# Patient Record
Sex: Female | Born: 1945
Health system: Southern US, Community
[De-identification: ages and names within clinical notes are randomized; demographics above are authoritative.]

## PROBLEM LIST (undated history)

## (undated) DIAGNOSIS — M1990 Unspecified osteoarthritis, unspecified site: Secondary | ICD-10-CM

## (undated) DIAGNOSIS — I10 Essential (primary) hypertension: Secondary | ICD-10-CM

## (undated) HISTORY — PX: TOTAL HIP ARTHROPLASTY: SHX124

## (undated) HISTORY — PX: ABDOMINAL HYSTERECTOMY: SHX81

---

## 2006-07-02 ENCOUNTER — Ambulatory Visit: Payer: Self-pay | Admitting: Family Medicine

## 2007-07-13 ENCOUNTER — Ambulatory Visit: Payer: Self-pay | Admitting: Family Medicine

## 2007-07-15 ENCOUNTER — Ambulatory Visit: Payer: Self-pay | Admitting: Family Medicine

## 2008-08-08 ENCOUNTER — Ambulatory Visit: Payer: Self-pay | Admitting: Family Medicine

## 2010-10-19 IMAGING — MG MAM DGTL SCREENING MAMMO W/CAD
1 series · 4 of 4 positions shown · non-contrast
Comparison: none

REASON FOR EXAM: Screening mammogram
COMMENTS:

[R CC · right · 4 of 4 slices shown]
[im 1/4]
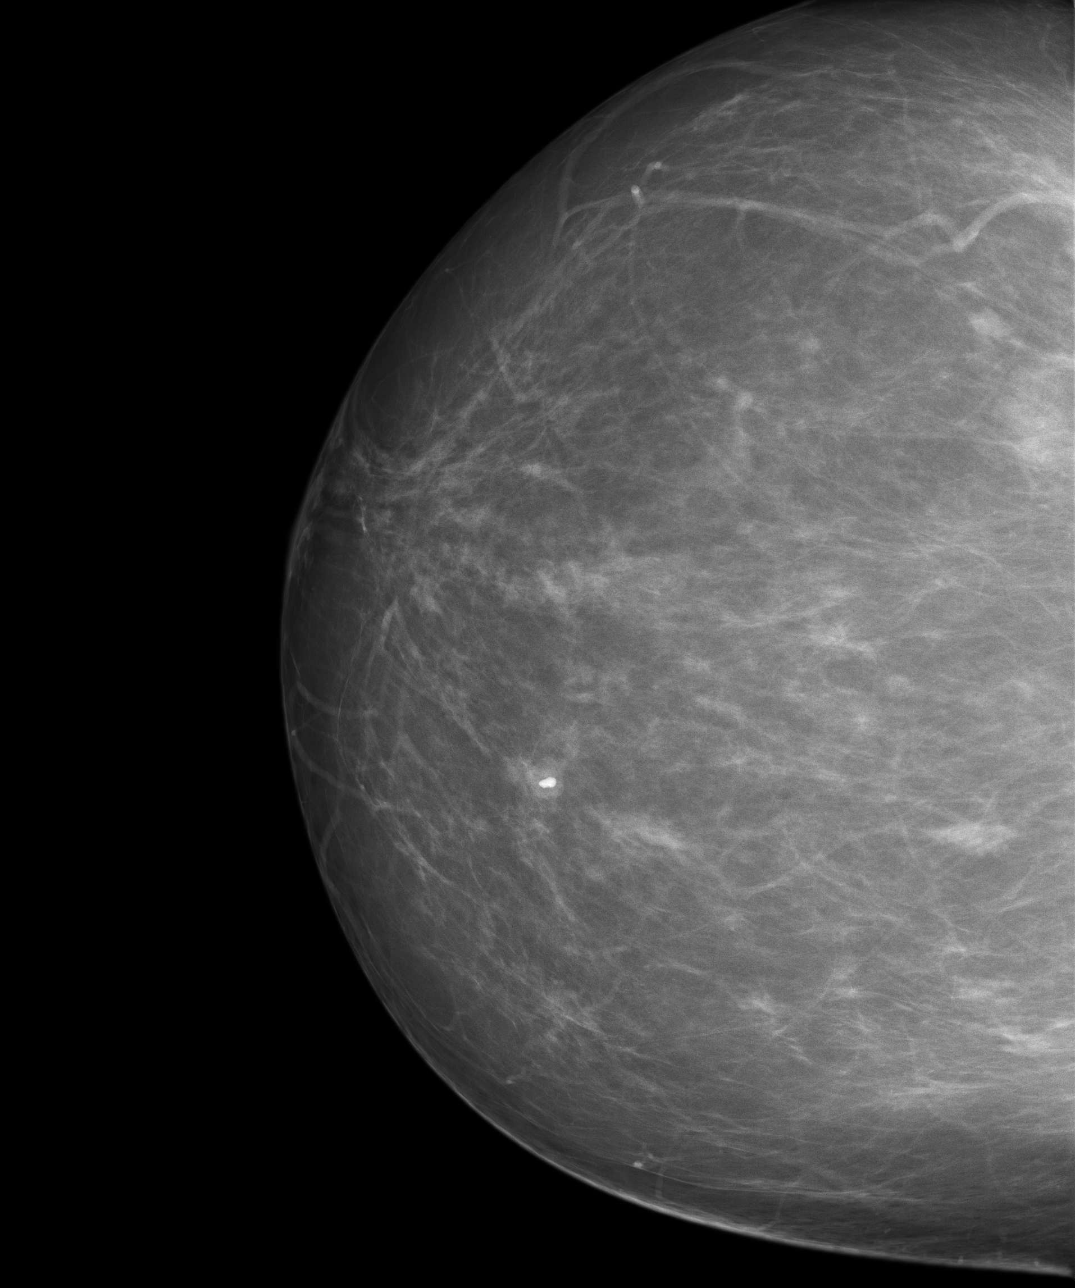
[im 2/4]
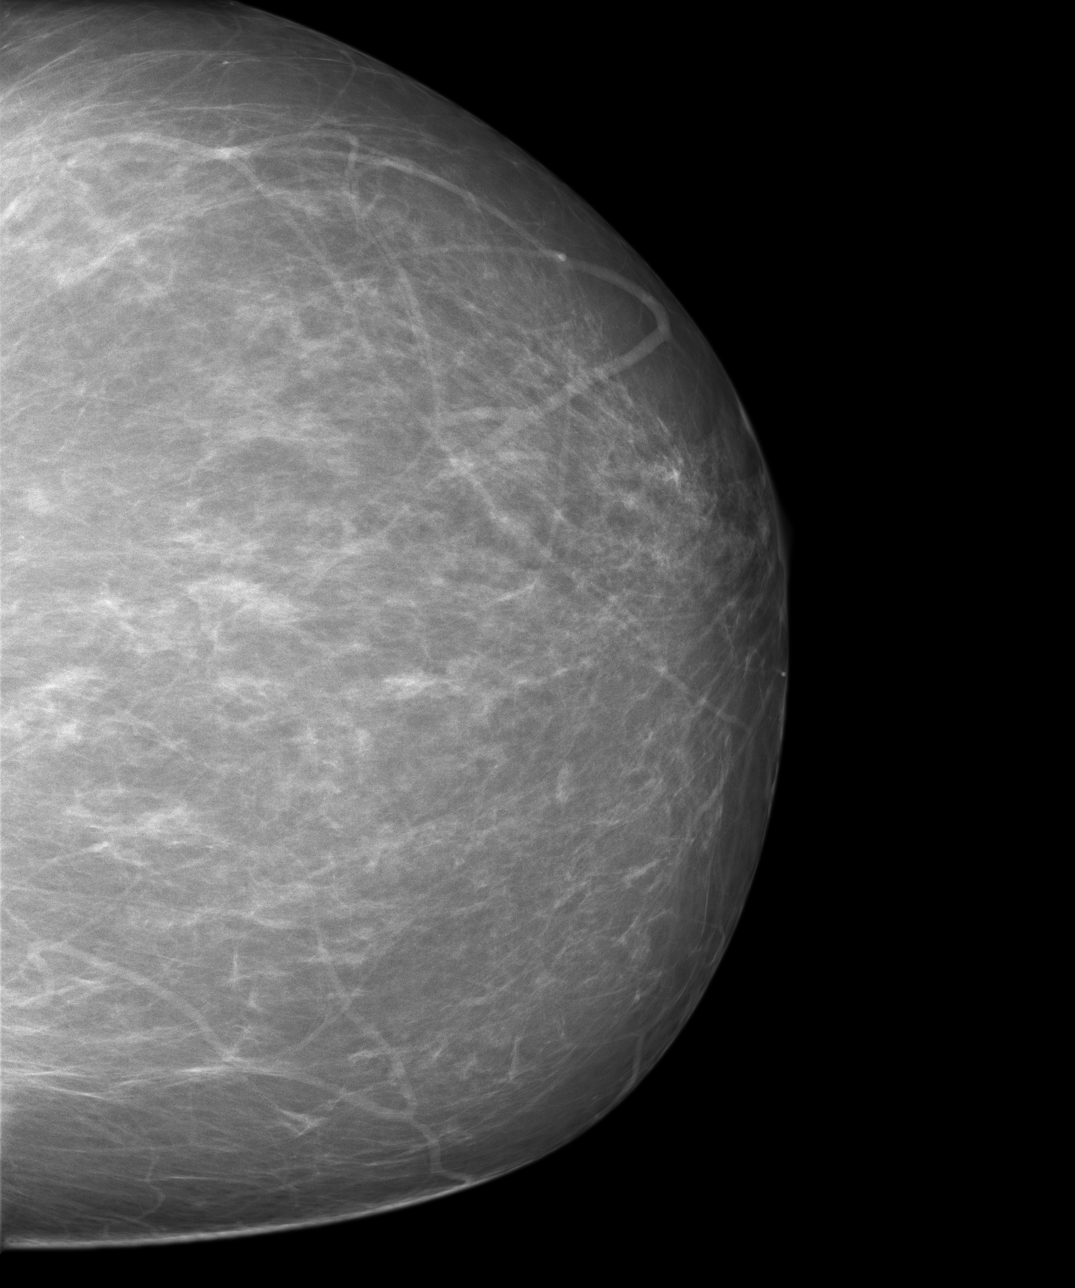
[im 3/4]
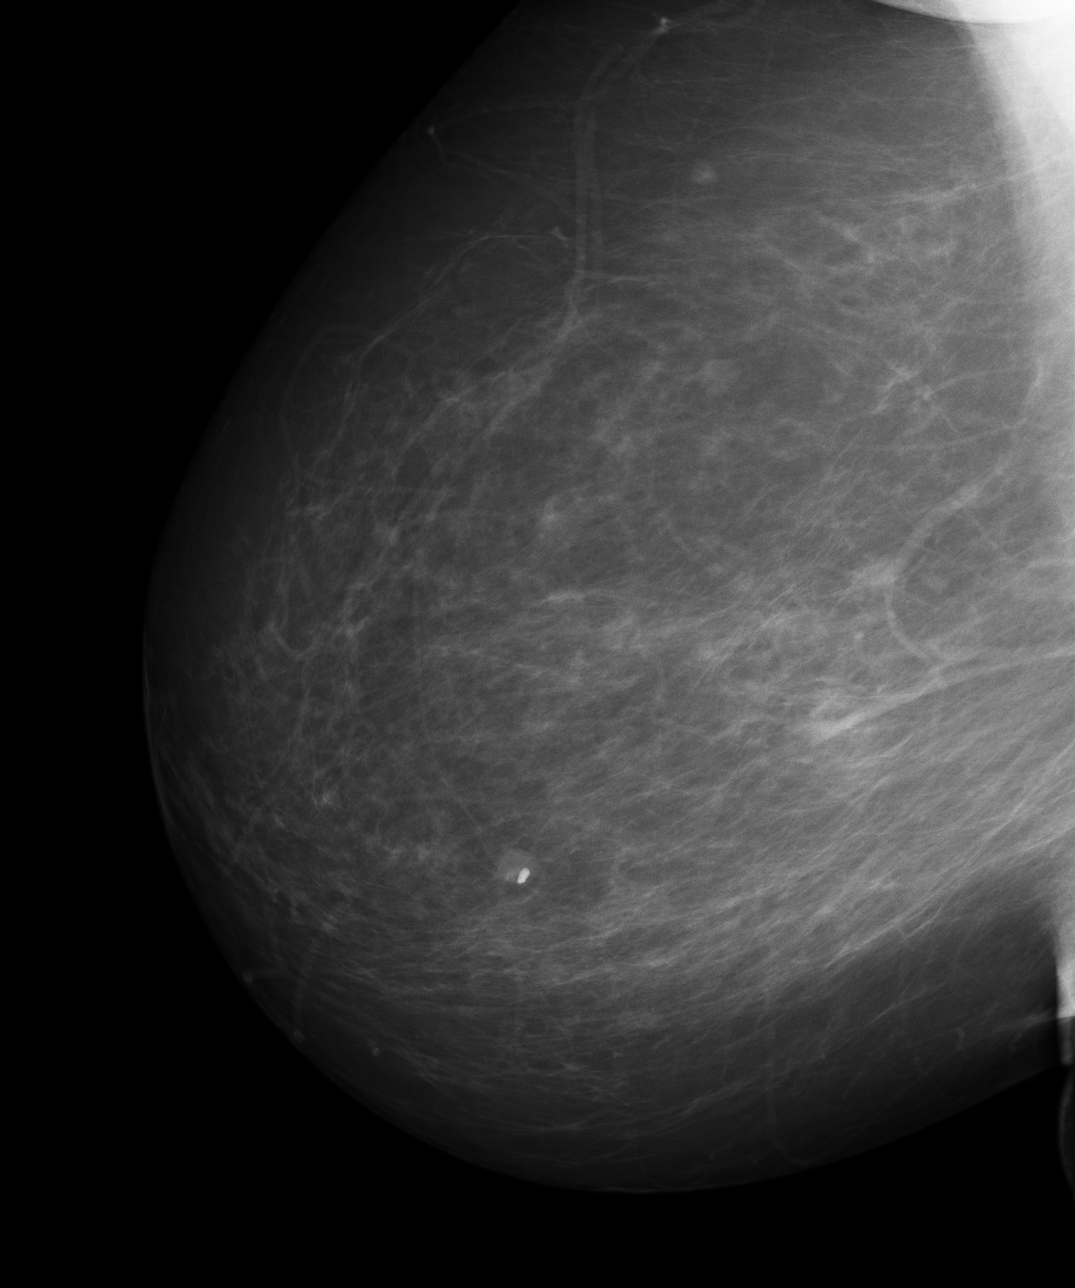
[im 4/4]
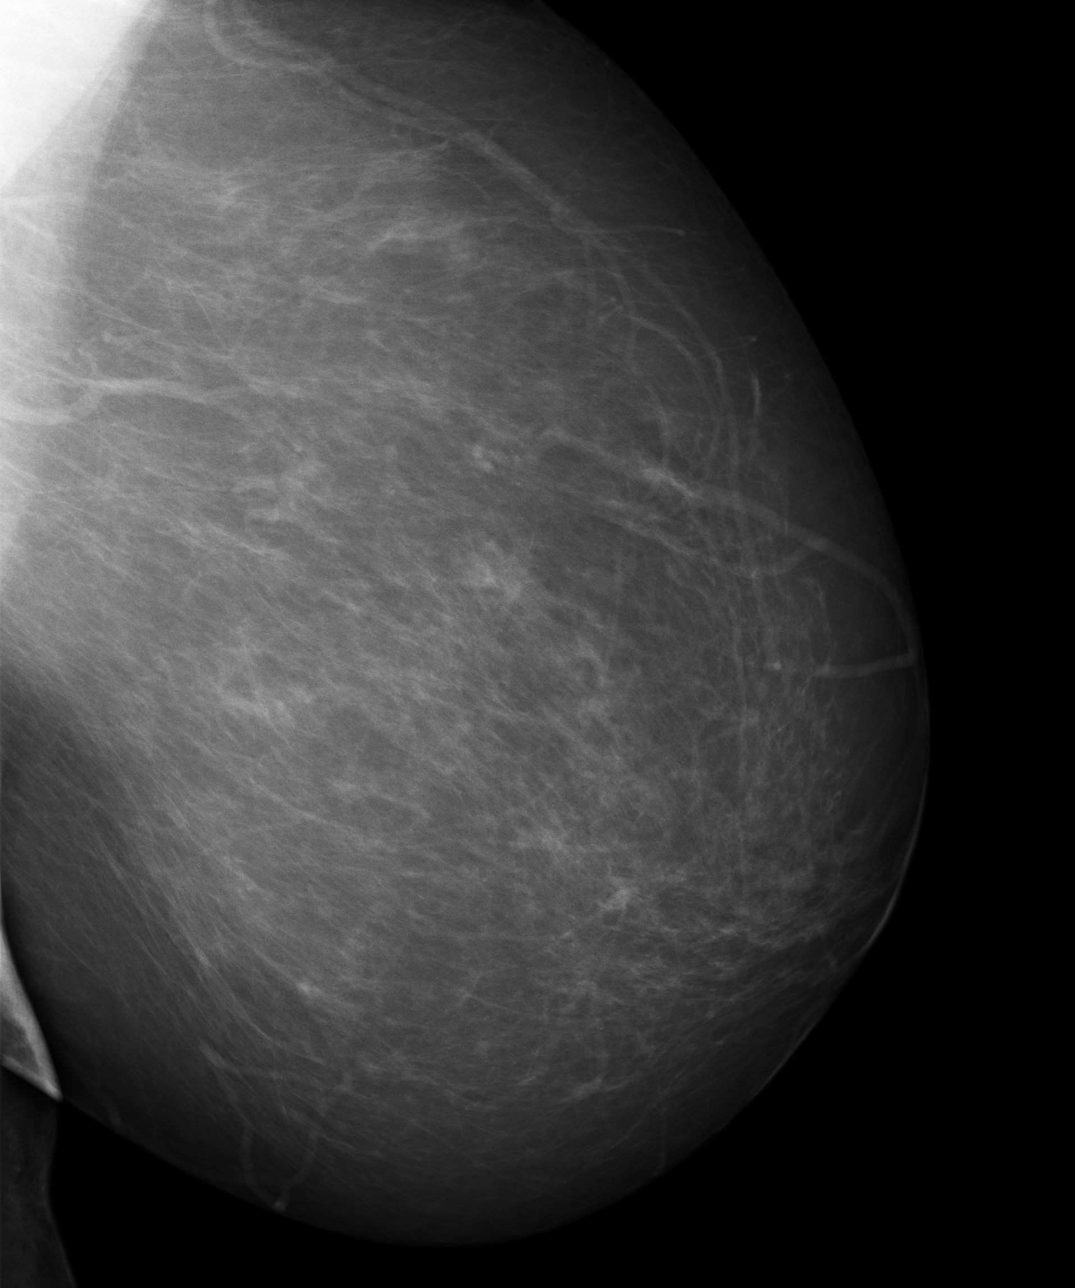

[4 of 4 positions shown; findings below may reference images not displayed]

PROCEDURE:     MAM - MAM DGTL SCREENING MAMMO W/CAD  - August 08, 2008  [DATE]

RESULT:     Comparison is made to prior studies dated 03/21/2003, 07/02/2006
and 07/13/2007.

The breasts demonstrate a moderate heterogeneous parenchymal pattern.
Coarse, stable, benign appearing calcifications are demonstrated within the
RIGHT breast. There does not appear to be new radiographic evidence to
suggest malignancy. The area of asymmetric density described within the
RIGHT breast appears unchanged when compared to previous studies.
IMPRESSION: BI-RADS: Category 2 - Benign Finding

## 2018-09-20 ENCOUNTER — Other Ambulatory Visit: Payer: Self-pay | Admitting: Student

## 2018-09-20 DIAGNOSIS — Z1231 Encounter for screening mammogram for malignant neoplasm of breast: Secondary | ICD-10-CM

## 2018-09-30 ENCOUNTER — Encounter (INDEPENDENT_AMBULATORY_CARE_PROVIDER_SITE_OTHER): Payer: Self-pay

## 2018-09-30 ENCOUNTER — Ambulatory Visit
Admission: RE | Admit: 2018-09-30 | Discharge: 2018-09-30 | Disposition: A | Discharge status: Home / Self Care | Payer: Medicare HMO | Source: Ambulatory Visit | Attending: Student | Admitting: Student

## 2018-09-30 DIAGNOSIS — Z1231 Encounter for screening mammogram for malignant neoplasm of breast: Secondary | ICD-10-CM | POA: Insufficient documentation

## 2019-10-12 ENCOUNTER — Other Ambulatory Visit: Payer: Self-pay | Admitting: Student

## 2019-10-19 ENCOUNTER — Other Ambulatory Visit: Payer: Self-pay | Admitting: Student

## 2019-10-19 DIAGNOSIS — Z1231 Encounter for screening mammogram for malignant neoplasm of breast: Secondary | ICD-10-CM

## 2020-12-10 IMAGING — MG DIGITAL SCREENING BILATERAL MAMMOGRAM WITH TOMO AND CAD
3 series · 3 of 11 positions shown · non-contrast
Comparison: Previous exam(s).

CLINICAL DATA: Screening.

EXAM:
DIGITAL SCREENING BILATERAL MAMMOGRAM WITH TOMO AND CAD

[R CC synth-2D]
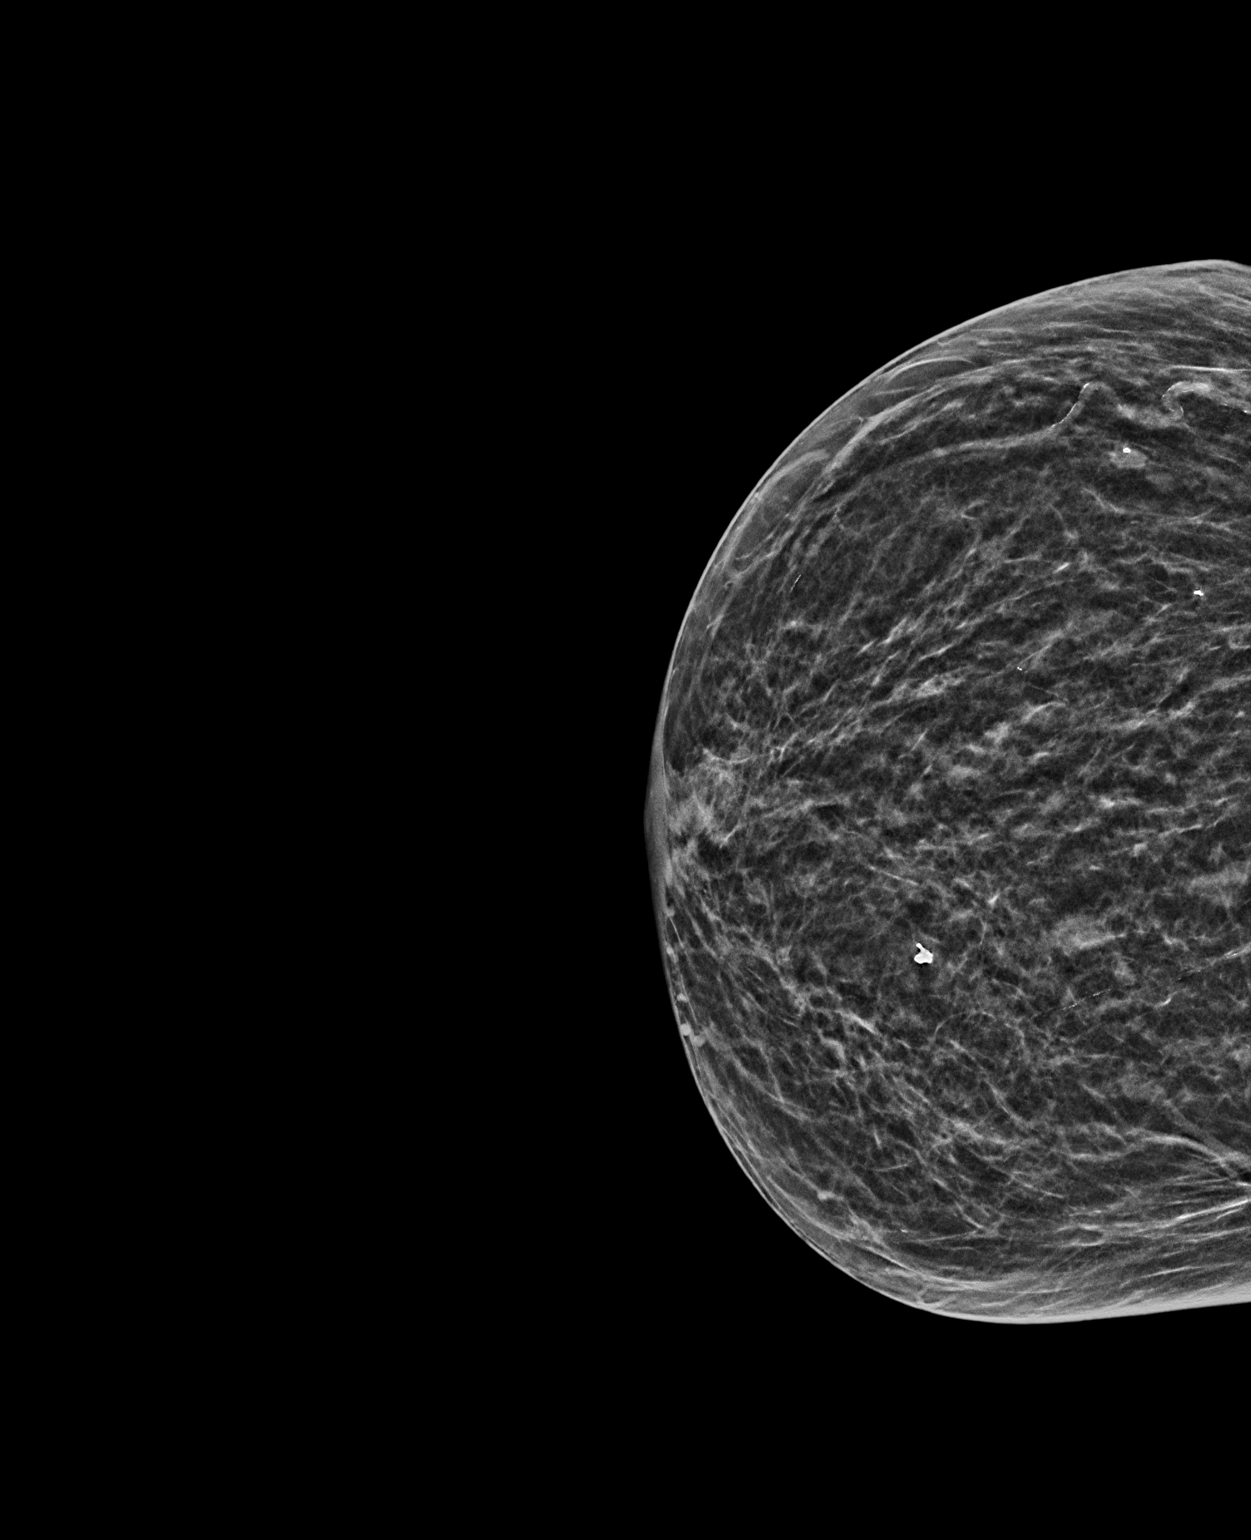

[L CC tomo · tomo slice 22/43.0]
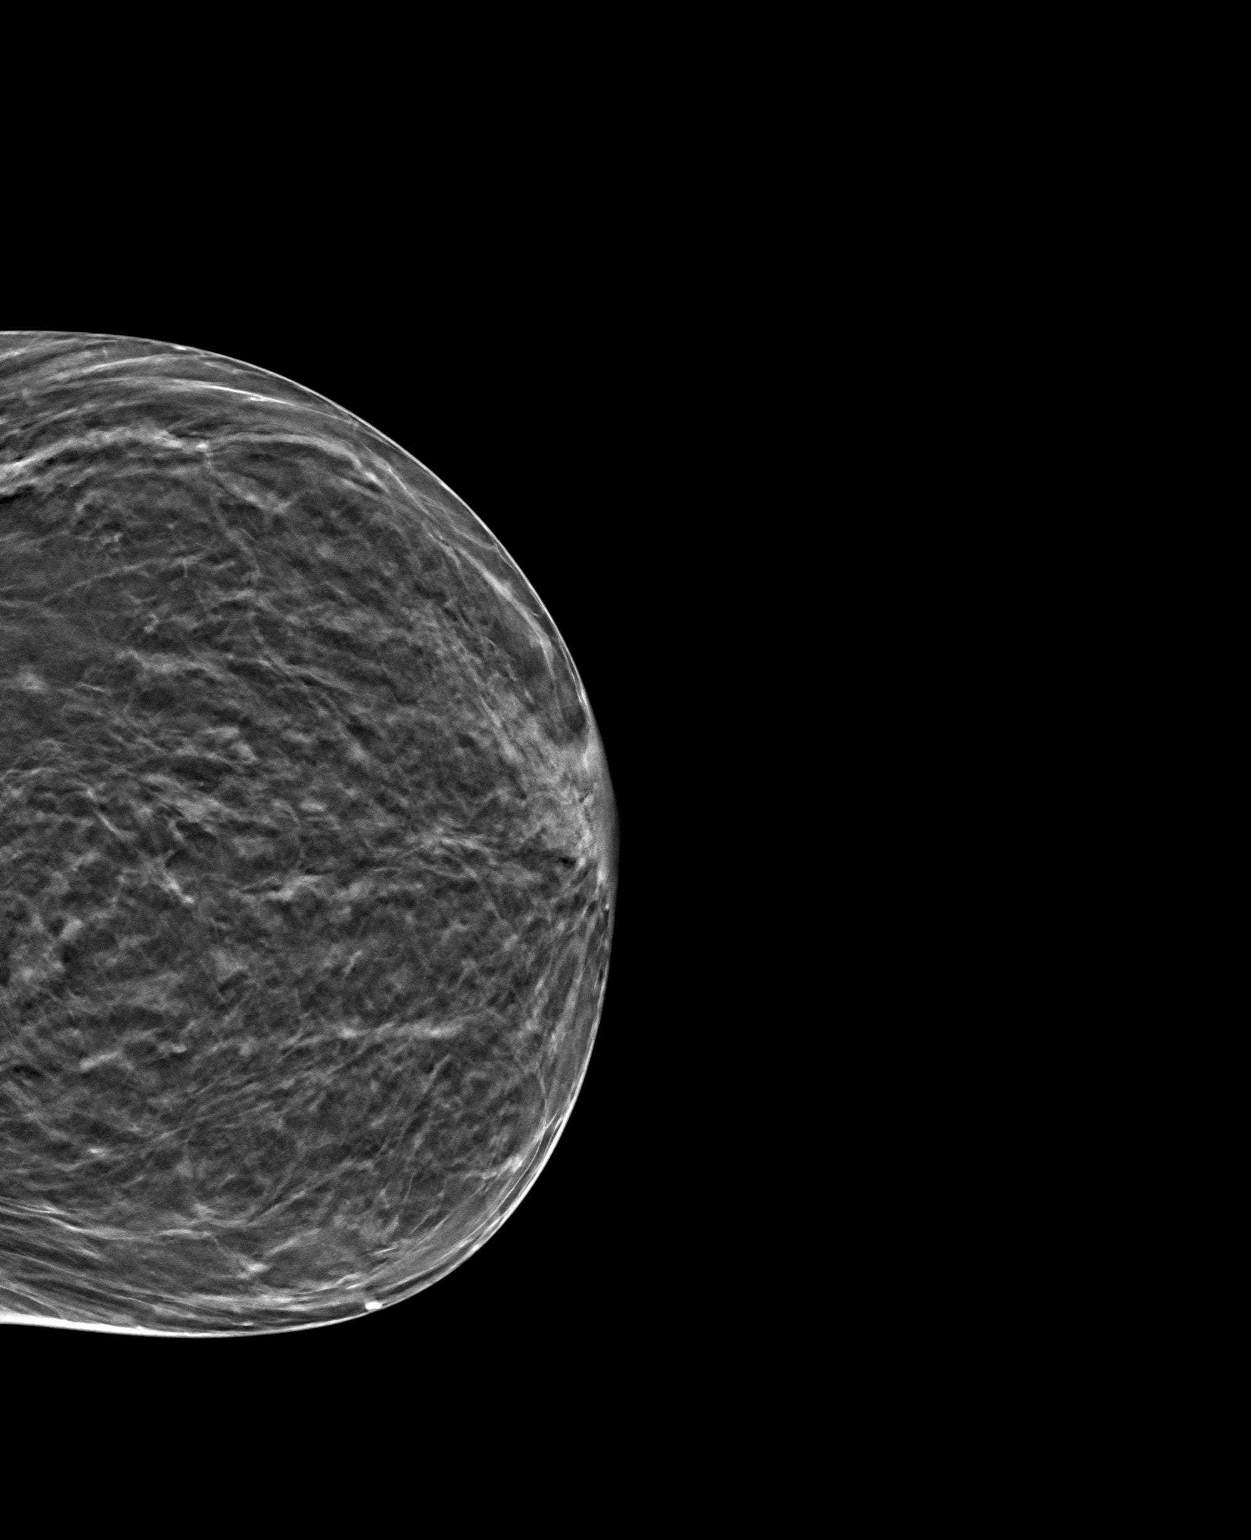

[R CC tomo · tomo slice 22/43.0]
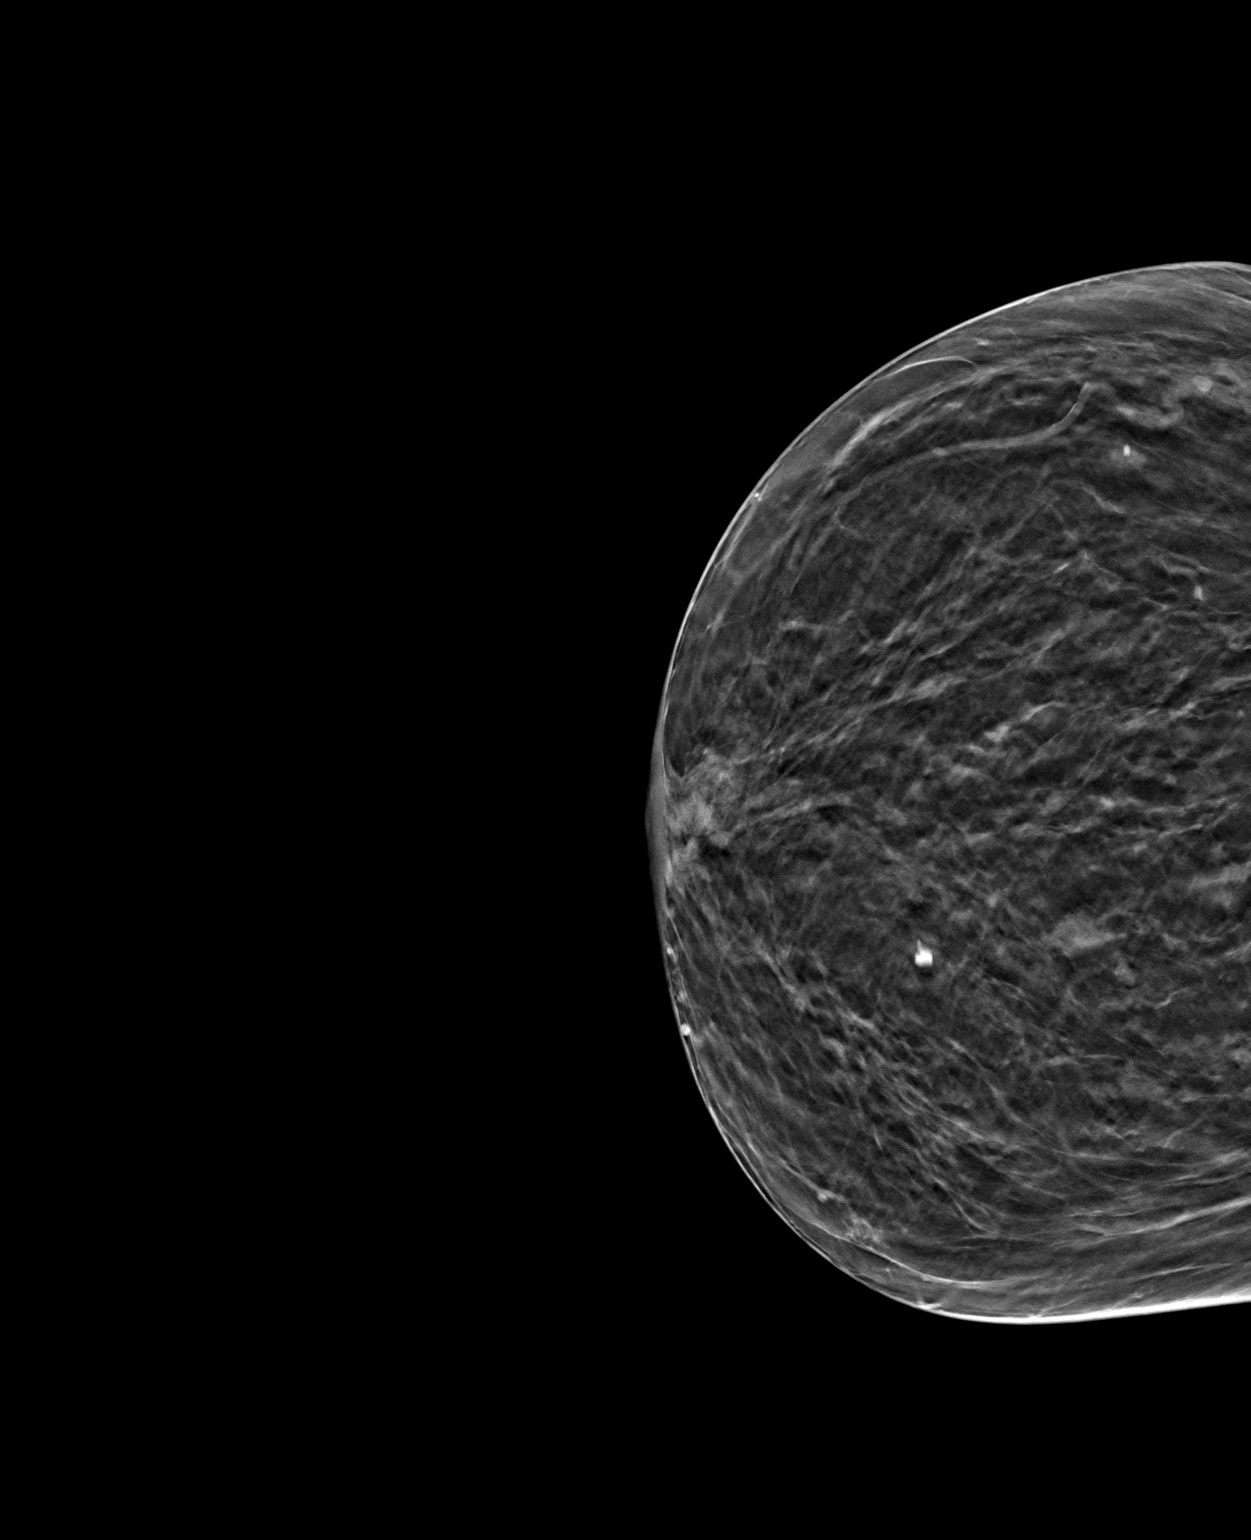

[3 of 11 positions shown; findings below may reference images not displayed]

ACR Breast Density Category b: There are scattered areas of
fibroglandular density.
FINDINGS: There are no findings suspicious for malignancy. Images were
processed with CAD.
IMPRESSION: No mammographic evidence of malignancy. A result letter of this
screening mammogram will be mailed directly to the patient.

RECOMMENDATION:
Screening mammogram in one year. (Code:[id number])

BI-RADS CATEGORY  1: Negative.

## 2020-12-17 ENCOUNTER — Ambulatory Visit: Payer: Self-pay

## 2020-12-17 ENCOUNTER — Ambulatory Visit
Admission: EM | Admit: 2020-12-17 | Discharge: 2020-12-17 | Disposition: A | Discharge status: Home / Self Care | Payer: Medicare HMO | Attending: Sports Medicine | Admitting: Sports Medicine

## 2020-12-17 ENCOUNTER — Encounter: Payer: Self-pay | Admitting: Emergency Medicine

## 2020-12-17 ENCOUNTER — Other Ambulatory Visit: Payer: Self-pay

## 2020-12-17 DIAGNOSIS — B349 Viral infection, unspecified: Secondary | ICD-10-CM | POA: Diagnosis not present

## 2020-12-17 DIAGNOSIS — Z20822 Contact with and (suspected) exposure to covid-19: Secondary | ICD-10-CM | POA: Insufficient documentation

## 2020-12-17 DIAGNOSIS — R509 Fever, unspecified: Secondary | ICD-10-CM | POA: Diagnosis not present

## 2020-12-17 DIAGNOSIS — M791 Myalgia, unspecified site: Secondary | ICD-10-CM | POA: Insufficient documentation

## 2020-12-17 HISTORY — DX: Essential (primary) hypertension: I10

## 2020-12-17 HISTORY — DX: Unspecified osteoarthritis, unspecified site: M19.90

## 2020-12-17 LAB — RESP PANEL BY RT-PCR (FLU A&B, COVID) ARPGX2
Influenza A by PCR: NEGATIVE
Influenza B by PCR: NEGATIVE
SARS Coronavirus 2 by RT PCR: NEGATIVE

## 2020-12-17 NOTE — Discharge Instructions (Addendum)
Your Covid, influenza A, and influenza B tests were all negative.  You are already on meloxicam so no Motrin, Advil, Naprosyn, ibuprofen, or Aleve.  You can only take Tylenol as needed. I gave you some educational handouts. If your symptoms were to worsen in any way please go to the emergency room.  I will be get the feeling better. Dr. Zachery Dauer

## 2020-12-17 NOTE — ED Triage Notes (Signed)
Patient in today c/o chills and fever (100 at the highest) x 2 days. Patient denies any cough, congestion or body aches. Patient has taken 2 at home covid test (12/15/20 and 12/17/20) both were negative. Patient has had the covid vaccines and the booster.  Patient has taken OTC Tylenol.

## 2020-12-21 NOTE — ED Provider Notes (Signed)
[facility]    CSN: [id number] Arrival date & time: 12/17/20  1311      History   Chief Complaint Chief Complaint  Patient presents with  . Fever  . Chills  . Appointment    HPI Ann Gaines is a 75 y.o. female.   Patient is a pleasant 75 year old female who presents for evaluation of 3 days of body aches and fevers.  She is also had chills.  No chest pain or shortness of breath.  No cough sore throat or headache.  No abdominal pain nausea or vomiting or diarrhea.  No urinary symptoms.  Normally sees [facility] for ongoing medical care but could not get an appointment.  She comes into the urgent care for initial evaluation and management.  She has been vaccinated against COVID and has received her booster.  She is also providing her flu shot.  No COVID exposure or COVID history.  No chest pain or shortness of breath.  Patient denies any recent changes in her gait pattern.  She is status post left hip arthroplasty, he denies any hip or groin pain.  No back pain, no radicular symptoms elicited.  No red flag signs or symptoms elicited on history.     Past Medical History:  Diagnosis Date  . Arthritis   . Hypertension     There are no problems to display for this patient.   Past Surgical History:  Procedure Laterality Date  . ABDOMINAL HYSTERECTOMY    . TOTAL HIP ARTHROPLASTY Left     OB History   No obstetric history on file.      Home Medications    Prior to Admission medications   Medication Sig Start Date End Date Taking? Authorizing Provider  aspirin 81 MG chewable tablet Chew 1 tablet by mouth daily.   Yes [provider]  Cholecalciferol 50 MCG (2000 UT) CAPS Take 1 tablet by mouth daily.   Yes [provider]  lisinopril (ZESTRIL) 10 MG tablet Take 10 mg by mouth daily. 10/16/20  Yes [provider]  loratadine (CLARITIN) 10 MG tablet Take 1 tablet by mouth daily.   Yes [provider]  meloxicam (MOBIC) 7.5 MG tablet Take 7.5 mg by mouth daily. 12/10/20  Yes [provider]    Family History Family History  Problem Relation Age of Onset  . Breast cancer Maternal Aunt 65  . Osteoarthritis Mother   . Heart disease Mother   . GER disease Father   . Pneumonia Father     Social History Social History   Tobacco Use  . Smoking status: Never Smoker  . Smokeless tobacco: Never Used  Vaping Use  . Vaping Use: Never used  Substance Use Topics  . Alcohol use: Never  . Drug use: Never     Allergies   Sulfa antibiotics   Review of Systems Review of Systems  Constitutional: Positive for chills, diaphoresis, fatigue and fever. Negative for activity change and appetite change.  HENT: Negative for congestion, ear discharge, ear pain, postnasal drip, rhinorrhea, sinus pressure, sinus pain and sore throat.   Eyes: Negative.   Respiratory: Negative.  Negative for cough, chest tightness, shortness of breath, wheezing and stridor.   Cardiovascular: Negative.  Negative for chest pain and palpitations.  Gastrointestinal: Negative.  Negative for abdominal pain, constipation, diarrhea, nausea and vomiting.  Genitourinary: Negative for dysuria, flank pain, frequency, hematuria, pelvic pain, urgency, vaginal bleeding, vaginal discharge and vaginal pain.  Musculoskeletal:  Positive for myalgias. Negative for arthralgias, back pain, gait problem, joint swelling, neck pain and neck stiffness.  Skin: Negative for color change, rash and wound.  Neurological: Negative for dizziness, tremors, seizures, syncope, speech difficulty, weakness, light-headedness and numbness.  All other systems reviewed and are negative.    Physical Exam Triage Vital Signs ED Triage Vitals  Enc Vitals Group     BP 12/17/20 1403 133/69     Pulse Rate 12/17/20 1403 (!) 110     Resp 12/17/20 1403 18     Temp 12/17/20 1403 (!) 100.4 F (38 C)     Temp Source 12/17/20 1403 Oral     SpO2  12/17/20 1403 98 %     Weight 12/17/20 1405 139 lb (63 kg)     Height 12/17/20 1405 5\' 7"  (1.702 m)     Head Circumference --      Peak Flow --      Pain Score 12/17/20 1405 0     Pain Loc --      Pain Edu? --      Excl. in GC? --    No data found.  Updated Vital Signs BP 133/69 (BP Location: Left Arm)   Pulse (!) 110   Temp (!) 100.4 F (38 C) (Oral)   Resp 18   Ht 5\' 7"  (1.702 m)   Wt 63 kg   SpO2 98%   BMI 21.77 kg/m   Visual Acuity Right Eye Distance:   Left Eye Distance:   Bilateral Distance:    Right Eye Near:   Left Eye Near:    Bilateral Near:     Physical Exam Vitals and nursing note reviewed.  Constitutional:      General: She is not in acute distress.    Appearance: Normal appearance. She is not ill-appearing, toxic-appearing or diaphoretic.  HENT:     Head: Normocephalic and atraumatic.     Right Ear: Tympanic membrane normal.     Left Ear: Tympanic membrane normal.     Nose: Nose normal. No congestion or rhinorrhea.     Mouth/Throat:     Mouth: Mucous membranes are moist.     Pharynx: No oropharyngeal exudate or posterior oropharyngeal erythema.  Eyes:     General: No scleral icterus.       Right eye: No discharge.        Left eye: No discharge.     Extraocular Movements: Extraocular movements intact.     Conjunctiva/sclera: Conjunctivae normal.     Pupils: Pupils are equal, round, and reactive to light.  Cardiovascular:     Rate and Rhythm: Normal rate and regular rhythm.     Pulses: Normal pulses.     Heart sounds: No murmur heard. No friction rub. No gallop.      Comments: Heart rate recheck was 96 bpm. Pulmonary:     Effort: Pulmonary effort is normal. No respiratory distress.     Breath sounds: Normal breath sounds. No stridor. No wheezing, rhonchi or rales.  Abdominal:     General: Bowel sounds are normal. There is no distension.     Palpations: Abdomen is soft.     Tenderness: There is no abdominal tenderness. There is no right CVA  tenderness, left CVA tenderness, guarding or rebound.  Musculoskeletal:     Cervical back: Normal range of motion and neck supple. No rigidity or tenderness.  Lymphadenopathy:     Cervical: No cervical adenopathy.  Skin:    General: Skin is  warm and dry.     Capillary Refill: Capillary refill takes less than 2 seconds.     Findings: No lesion or rash.  Neurological:     General: No focal deficit present.     Mental Status: She is alert and oriented to person, place, and time.      UC Treatments / Results  Labs (all labs ordered are listed, but only abnormal results are displayed) Labs Reviewed  RESP PANEL BY RT-PCR (FLU A&B, COVID) [id number]    EKG   Radiology No results found.  Procedures Procedures (including critical care time)  Medications Ordered in UC Medications - No data to display  Initial Impression / Assessment and Plan / UC Course  I have reviewed the triage vital signs and the nursing notes.  Pertinent labs & imaging results that were available during my care of the patient were reviewed by me and considered in my medical decision making (see chart for details).   Clinical impression: Pleasant 75 year old female presents with myalgias, fever, and chills x3 days.  Seems consistent with a viral process.  No respiratory abdominal urinary or musculoskeletal complaints.  Examination is reassuring.  Treatment plan: 1.  The findings and treatment plan were discussed in detail with the patient.  Patient was in agreement. 2.  We will go ahead and test her for Covid, influenza A and influenza B.  Results are above.  All were negative. 3.  She is already on meloxicam so I have asked her just use Tylenol only for her fever. 4.  Provided educational handouts. 5.  Supportive care, plenty of rest, plenty of fluids. 6.  Have encouraged her to make a appointment with her primary care physician to be reevaluated in a few days. 7.  If her symptoms were to worsen or fever is  not well controlled then she needs to go to the emergency room for further evaluation.  She voiced verbal understanding. 8.  Follow-up here as needed.    Final Clinical Impressions(s) / UC Diagnoses   Final diagnoses:  Viral illness  Intermittent fever  Myalgia     Discharge Instructions     Your Covid test was pending at the time of discharge.  You are already on meloxicam so no Motrin, Advil, Naprosyn, ibuprofen, or Aleve.  You can only take Tylenol as needed. I gave you some educational handouts. If your symptoms were to worsen in any way please go to the emergency room.  I will be get the feeling better. Dr. Zachery Dauer   ED Prescriptions    None     PDMP not reviewed this encounter.   Delton See, MD 12/21/20 1531

## 2023-03-13 ENCOUNTER — Other Ambulatory Visit: Payer: Self-pay | Admitting: Student

## 2023-03-13 DIAGNOSIS — Z780 Asymptomatic menopausal state: Secondary | ICD-10-CM

## 2024-03-09 DIAGNOSIS — Z780 Asymptomatic menopausal state: Secondary | ICD-10-CM

## 2024-03-14 ENCOUNTER — Other Ambulatory Visit: Payer: Self-pay

## 2024-03-14 DIAGNOSIS — Z780 Asymptomatic menopausal state: Secondary | ICD-10-CM

## 2024-04-12 ENCOUNTER — Other Ambulatory Visit: Payer: Self-pay | Admitting: Family Medicine

## 2024-04-12 DIAGNOSIS — R7989 Other specified abnormal findings of blood chemistry: Secondary | ICD-10-CM

## 2024-07-08 ENCOUNTER — Other Ambulatory Visit: Payer: Self-pay | Admitting: Family

## 2024-07-08 DIAGNOSIS — N1832 Chronic kidney disease, stage 3b: Secondary | ICD-10-CM

## 2024-07-19 ENCOUNTER — Ambulatory Visit
Admission: RE | Admit: 2024-07-19 | Discharge: 2024-07-19 | Disposition: A | Discharge status: Home / Self Care | Source: Ambulatory Visit | Attending: Family | Admitting: Family

## 2024-07-19 DIAGNOSIS — N1832 Chronic kidney disease, stage 3b: Secondary | ICD-10-CM | POA: Diagnosis present
